# Patient Record
Sex: Male | Born: 1950 | Race: White | Hispanic: No | Marital: Single
Health system: Southern US, Community
[De-identification: ages and names within clinical notes are randomized; demographics above are authoritative.]

---

## 2021-01-05 ENCOUNTER — Other Ambulatory Visit: Payer: Self-pay

## 2021-01-05 DIAGNOSIS — F329 Major depressive disorder, single episode, unspecified: Secondary | ICD-10-CM | POA: Insufficient documentation

## 2021-01-05 DIAGNOSIS — R4701 Aphasia: Secondary | ICD-10-CM | POA: Diagnosis present

## 2021-01-05 DIAGNOSIS — F349 Persistent mood [affective] disorder, unspecified: Secondary | ICD-10-CM | POA: Insufficient documentation

## 2021-01-05 DIAGNOSIS — F39 Unspecified mood [affective] disorder: Secondary | ICD-10-CM | POA: Diagnosis not present

## 2021-01-05 DIAGNOSIS — G9389 Other specified disorders of brain: Secondary | ICD-10-CM | POA: Diagnosis not present

## 2021-01-05 DIAGNOSIS — F23 Brief psychotic disorder: Secondary | ICD-10-CM | POA: Insufficient documentation

## 2021-01-05 DIAGNOSIS — Z046 Encounter for general psychiatric examination, requested by authority: Secondary | ICD-10-CM | POA: Diagnosis present

## 2021-01-05 DIAGNOSIS — G319 Degenerative disease of nervous system, unspecified: Secondary | ICD-10-CM | POA: Insufficient documentation

## 2021-01-05 DIAGNOSIS — I6782 Cerebral ischemia: Secondary | ICD-10-CM | POA: Insufficient documentation

## 2021-01-05 DIAGNOSIS — Z20822 Contact with and (suspected) exposure to covid-19: Secondary | ICD-10-CM | POA: Insufficient documentation

## 2021-01-05 NOTE — ED Notes (Signed)
PT refusing blood at this time

## 2021-01-05 NOTE — ED Notes (Addendum)
Carney Bern overalls Sneakers Socks Black suitcase Jacket  Locked in bhu locker  825$, black wallet, [employer] [place] card and 2 sets of keys locked up in [employer]. Counted and verified amount by Melody EDT and Modena Bellemare RN.

## 2021-01-05 NOTE — ED Triage Notes (Signed)
Pt brought by [facility] today under IVC, pt denies any SI or HI at this time. States does feel depressed.

## 2021-01-06 ENCOUNTER — Emergency Department: Payer: Medicare Other

## 2021-01-06 ENCOUNTER — Emergency Department
Admission: EM | Admit: 2021-01-06 | Discharge: 2021-01-08 | Payer: Medicare Other | Attending: Emergency Medicine | Admitting: Emergency Medicine

## 2021-01-06 ENCOUNTER — Emergency Department
Admission: EM | Admit: 2021-01-06 | Discharge: 2021-01-06 | Disposition: A | Discharge status: Home / Self Care | Payer: Medicare Other | Attending: Emergency Medicine | Admitting: Emergency Medicine

## 2021-01-06 DIAGNOSIS — F349 Persistent mood [affective] disorder, unspecified: Secondary | ICD-10-CM | POA: Diagnosis not present

## 2021-01-06 DIAGNOSIS — G40909 Epilepsy, unspecified, not intractable, without status epilepticus: Secondary | ICD-10-CM

## 2021-01-06 DIAGNOSIS — F39 Unspecified mood [affective] disorder: Secondary | ICD-10-CM

## 2021-01-06 DIAGNOSIS — R4701 Aphasia: Secondary | ICD-10-CM

## 2021-01-06 DIAGNOSIS — S098XXA Other specified injuries of head, initial encounter: Secondary | ICD-10-CM

## 2021-01-06 DIAGNOSIS — F23 Brief psychotic disorder: Secondary | ICD-10-CM

## 2021-01-06 DIAGNOSIS — F32A Depression, unspecified: Secondary | ICD-10-CM

## 2021-01-06 DIAGNOSIS — F411 Generalized anxiety disorder: Secondary | ICD-10-CM

## 2021-01-06 LAB — URINALYSIS, COMPLETE (UACMP) WITH MICROSCOPIC
Bacteria, UA: NONE SEEN
Bilirubin Urine: NEGATIVE
Glucose, UA: NEGATIVE mg/dL
Ketones, ur: NEGATIVE mg/dL
Leukocytes,Ua: NEGATIVE
Nitrite: NEGATIVE
Protein, ur: NEGATIVE mg/dL
Specific Gravity, Urine: 1.003 — ABNORMAL LOW (ref 1.005–1.030)
Squamous Epithelial / LPF: NONE SEEN (ref 0–5)
pH: 7 (ref 5.0–8.0)

## 2021-01-06 LAB — RESP PANEL BY RT-PCR (FLU A&B, COVID) ARPGX2
Influenza A by PCR: NEGATIVE
Influenza B by PCR: NEGATIVE
SARS Coronavirus 2 by RT PCR: NEGATIVE

## 2021-01-06 LAB — COMPREHENSIVE METABOLIC PANEL
ALT: 15 U/L (ref 0–44)
AST: 16 U/L (ref 15–41)
Albumin: 3.8 g/dL (ref 3.5–5.0)
Alkaline Phosphatase: 88 U/L (ref 38–126)
Anion gap: 11 (ref 5–15)
BUN: 9 mg/dL (ref 8–23)
CO2: 26 mmol/L (ref 22–32)
Calcium: 8.6 mg/dL — ABNORMAL LOW (ref 8.9–10.3)
Chloride: 95 mmol/L — ABNORMAL LOW (ref 98–111)
Creatinine, Ser: 0.72 mg/dL (ref 0.61–1.24)
GFR, Estimated: >60 mL/min (ref >60)
Glucose, Bld: 102 mg/dL — ABNORMAL HIGH (ref 70–99)
Potassium: 4 mmol/L (ref 3.5–5.1)
Sodium: 132 mmol/L — ABNORMAL LOW (ref 135–145)
Total Bilirubin: 0.5 mg/dL (ref 0.3–1.2)
Total Protein: 8.2 g/dL — ABNORMAL HIGH (ref 6.5–8.1)

## 2021-01-06 LAB — CBC
HCT: 35.3 % — ABNORMAL LOW (ref 39.0–52.0)
Hemoglobin: 12.1 g/dL — ABNORMAL LOW (ref 13.0–17.0)
MCH: 30.4 pg (ref 26.0–34.0)
MCHC: 34.3 g/dL (ref 30.0–36.0)
MCV: 88.7 fL (ref 80.0–100.0)
Platelets: 284 K/uL (ref 150–400)
RBC: 3.98 MIL/uL — ABNORMAL LOW (ref 4.22–5.81)
RDW: 14.2 % (ref 11.5–15.5)
WBC: 5.6 K/uL (ref 4.0–10.5)
nRBC: 0 % (ref 0.0–0.2)

## 2021-01-06 LAB — TROPONIN I (HIGH SENSITIVITY): Troponin I (High Sensitivity): 7 ng/L (ref <18)

## 2021-01-06 LAB — PHENYTOIN LEVEL, TOTAL: Phenytoin Lvl: 33.6 ug/mL (ref 10.0–20.0)

## 2021-01-06 LAB — URINE DRUG SCREEN, QUALITATIVE (ARMC ONLY)
Amphetamines, Ur Screen: NOT DETECTED
Barbiturates, Ur Screen: NOT DETECTED
Benzodiazepine, Ur Scrn: NOT DETECTED
Cannabinoid 50 Ng, Ur ~~LOC~~: NOT DETECTED
Cocaine Metabolite,Ur ~~LOC~~: NOT DETECTED
MDMA (Ecstasy)Ur Screen: NOT DETECTED
Methadone Scn, Ur: NOT DETECTED
Opiate, Ur Screen: NOT DETECTED
Phencyclidine (PCP) Ur S: NOT DETECTED
Tricyclic, Ur Screen: NOT DETECTED

## 2021-01-06 LAB — PROTIME-INR
INR: 2.1 — ABNORMAL HIGH (ref 0.8–1.2)
Prothrombin Time: 23 seconds — ABNORMAL HIGH (ref 11.4–15.2)

## 2021-01-06 MED ORDER — WARFARIN SODIUM 7.5 MG PO TABS
7.5000 mg | ORAL_TABLET | Freq: Every day | ORAL | Status: DC
  Filled 2021-01-06: qty 1

## 2021-01-06 MED ORDER — PHENYTOIN SODIUM EXTENDED 100 MG PO CAPS: 400.0000 mg | ORAL_CAPSULE | Freq: Every day | ORAL | Status: DC

## 2021-01-06 MED ORDER — WARFARIN SODIUM 5 MG PO TABS
5.0000 mg | ORAL_TABLET | ORAL | Status: DC
  Administered 2021-01-06: 5 mg via ORAL
  Filled 2021-01-06: qty 1

## 2021-01-06 MED ORDER — WARFARIN - PHARMACIST DOSING INPATIENT
Freq: Every day | Status: DC
  Filled 2021-01-06: qty 1

## 2021-01-06 MED ORDER — MIRTAZAPINE 15 MG PO TABS
7.5000 mg | ORAL_TABLET | Freq: Every day | ORAL | Status: DC
  Administered 2021-01-06 – 2021-01-07 (×2): 7.5 mg via ORAL
  Filled 2021-01-06 (×2): qty 1

## 2021-01-06 MED ORDER — WARFARIN SODIUM 7.5 MG PO TABS: 7.5000 mg | ORAL_TABLET | ORAL | Status: DC
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
## 2021-01-06 NOTE — ED Notes (Signed)
Patient ambulated to restroom without difficulty or need for assistance.

## 2021-01-06 NOTE — BH Assessment (Incomplete Revision)
Comprehensive Clinical Assessment (CCA) Note  01/06/2021 George Mann [id number]  Chief Complaint: Patient is a 70 year old male presenting to [facility] ED under IVC. Per triage note Pt brought by bpd today under IVC, pt denies any SI or HI at this time. States does feel depressed. During assessment patient appears alert and oriented x1, patent was unaware of where he was, what the situation was or the time. Patient's speech was pressured, thought process was disorganized and patient had difficulty answering questions and giving concrete answers. When asked if patient understands why he is here patient reported 'I forgot what I was doing, I want to get out of jail." Per IVC patient has a history of seizure disorder, Aphasia, possible dementia, attempted to jump from moving vehicle, has suicidal thoughts, and access to firearms. Patient was unable to continue assessment as patient appeared drowsy and incoherent.  Per Psyc NP Elenore Paddy patient to be reassessed Chief Complaint  Patient presents with   Psychiatric Evaluation   Visit Diagnosis: Acute Psychosis   CCA Screening, Triage and Referral (STR)  Patient Reported Information How did you hear about [place]? Other (Comment)  Referral name: No data recorded Referral phone number: No data recorded  Whom do you see for routine medical problems? Other (Comment)  Practice/Facility Name: No data recorded Practice/Facility Phone Number: No data recorded Name of Contact: No data recorded Contact Number: No data recorded Contact Fax Number: No data recorded Prescriber Name: No data recorded Prescriber Address (if known): No data recorded  What Is the Reason for Your Visit/Call Today? Patient has been IVC'd  How Long Has This Been Causing You Problems? > than 6 months  What Do You Feel Would Help You the Most Today? Assessment Only   Have You Recently Been in Any Inpatient Treatment (Hospital/Detox/Crisis Center/28-Day Program)?  No  Name/Location of Program/Hospital:No data recorded How Long Were You There? No data recorded When Were You Discharged? No data recorded  Have You Ever Received Services From [facility] Before? No  Who Do You See at [facility]? No data recorded  Have You Recently Had Any Thoughts About Hurting Yourself? No  Are You Planning to Commit Suicide/Harm Yourself At This time? No   Have you Recently Had Thoughts About Hurting Someone Karolee Ohs? No  Explanation: No data recorded  Have You Used Any Alcohol or Drugs in the Past 24 Hours? No  How Long Ago Did You Use Drugs or Alcohol? No data recorded What Did You Use and How Much? No data recorded  Do You Currently Have a Therapist/Psychiatrist? No  Name of Therapist/Psychiatrist: No data recorded  Have You Been Recently Discharged From Any Office Practice or Programs? No  Explanation of Discharge From Practice/Program: No data recorded    CCA Screening Triage Referral Assessment Type of Contact: Face-to-Face  Is this Initial or Reassessment? No data recorded Date Telepsych consult ordered in [facility]:  No data recorded Time Telepsych consult ordered in [facility]:  No data recorded  Patient Reported Information Reviewed? Yes  Patient Left Without Being Seen? No data recorded Reason for Not Completing Assessment: No data recorded  Collateral Involvement: No data recorded  Does Patient Have a Court Appointed Legal Guardian? No data recorded Name and Contact of Legal Guardian: No data recorded If Minor and Not Living with Parent(s), Who has Custody? No data recorded Is CPS involved or ever been involved? Never  Is APS involved or ever been involved? Never   Patient Determined To Be At Risk  for Harm To Self or Others Based on Review of Patient Reported Information or Presenting Complaint? No  Method: No data recorded Availability of Means: No data recorded Intent: No data recorded Notification Required: No data recorded Additional  Information for Danger to Others Potential: No data recorded Additional Comments for Danger to Others Potential: No data recorded Are There Guns or Other Weapons in Your Home? No data recorded Types of Guns/Weapons: No data recorded Are These Weapons Safely Secured?                            No data recorded Who Could Verify You Are Able To Have These Secured: No data recorded Do You Have any Outstanding Charges, Pending Court Dates, Parole/Probation? No data recorded Contacted To Inform of Risk of Harm To Self or Others: No data recorded  Location of Assessment: [facility] ED   Does Patient Present under Involuntary Commitment? Yes  IVC Papers Initial File Date: 01/06/2021   [place] of Residence: [place]   Patient Currently Receiving the Following Services: No data recorded  Determination of Need: Emergent (2 hours)   Options For Referral: No data recorded    CCA Biopsychosocial Intake/Chief Complaint:  Patient is presenting under IVC due to attempting to jump from a moving vehicle  Current Symptoms/Problems: Patient is presenting under IVC due to attempting to jump from a moving vehicle   Patient Reported Schizophrenia/Schizoaffective Diagnosis in Past: No   Strengths: UTA  Preferences: UTA  Abilities: UTA   Type of Services Patient Feels are Needed: UTA   Initial Clinical Notes/Concerns: UTA   Mental Health Symptoms Depression:  Hopelessness   Duration of Depressive symptoms: Greater than two weeks   Mania:  None   Anxiety:   None   Psychosis:  None   Duration of Psychotic symptoms: No data recorded  Trauma:  None   Obsessions:  None   Compulsions:  None   Inattention:  None   Hyperactivity/Impulsivity:  N/A   Oppositional/Defiant Behaviors:  None   Emotional Irregularity:  None   Other Mood/Personality Symptoms:  No data recorded   Mental Status Exam Appearance and self-care  Stature:  Average   Weight:  Average weight   Clothing:   Neat/clean   Grooming:  Normal   Cosmetic use:  None   Posture/gait:  Normal   Motor activity:  Not Remarkable   Sensorium  Attention:  Confused   Concentration:  Scattered   Orientation:  Person   Recall/memory:  Defective in Short-term   Affect and Mood  Affect:  Appropriate   Mood:  Other (Comment)   Relating  Eye contact:  Normal   Facial expression:  Responsive   Attitude toward examiner:  Cooperative   Thought and Language  Speech flow: Pressured   Thought content:  -- (Disorganized)   Preoccupation:  Other (Comment)   Hallucinations:  None   Organization:  No data recorded  [employer] of Knowledge:  Fair   Intelligence:  Average   Abstraction:  Functional   Judgement:  Poor   Reality Testing:  Distorted   Insight:  Poor   Decision Making:  Impulsive   Social Functioning  Social Maturity:  Impulsive   Social Judgement:  Heedless   Stress  Stressors:  Other (Comment)   Coping Ability:  Deficient supports   Skill Deficits:  None   Supports:  Other (Comment)     Religion: Religion/Spirituality Are You A  Religious Person?: No  Leisure/Recreation: Leisure / Recreation Do You Have Hobbies?: No  Exercise/Diet: Exercise/Diet Do You Exercise?: No Have You Gained or Lost A Significant Amount of Weight in the Past Six Months?: No Do You Follow a Special Diet?: No Do You Have Any Trouble Sleeping?: No   CCA Employment/Education Employment/Work Situation: Employment / Work Situation 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 illness:  (Unknown) What is the longest time patient has a held a job?: Unknown Where was the patient employed at that time?: Unknown Has patient ever been in the [employer]?:  (Unknown)  Education: Education Is Patient Currently Attending School?: No Did Garment/textile technologist From [employer]?:  (Unknown) Did You Have An Individualized Education Program (IIEP):  No Did You Have Any Difficulty At [employer]?: No Patient's Education Has Been Impacted by Current Illness: No   CCA Family/Childhood History Family and Relationship History: Family history Marital status: Single Are you sexually active?:  (Unknown) What is your sexual orientation?: Unknown Has your sexual activity been affected by drugs, alcohol, medication, or emotional stress?: Unknown Does patient have children?: No  Childhood History:  Childhood History By whom was/is the patient raised?: Other (Comment) Additional childhood history information: UTA Description of patient's relationship with caregiver when they were a child: UTA Patient's description of current relationship with people who raised him/her: UTA How were you disciplined when you got in trouble as a child/adolescent?: UTA Does patient have siblings?:  (UTA) Did patient suffer any verbal/emotional/physical/sexual abuse as a child?:  (UTA) Did patient suffer from severe childhood neglect?:  (UTA) Has patient ever been sexually abused/assaulted/raped as an adolescent or adult?:  (UTA) Was the patient ever a victim of a crime or a disaster?:  (UTA) Witnessed domestic violence?:  (UTA) Has patient been affected by domestic violence as an adult?:  Industrial/product designer)  Child/Adolescent Assessment:     CCA Substance Use Alcohol/Drug Use: Alcohol / Drug Use Pain Medications: See MAR Prescriptions: See MAR Over the Counter: See MAR History of alcohol / drug use?: No history of alcohol / drug abuse                         ASAM's:  Six Dimensions of Multidimensional Assessment  Dimension 1:  Acute Intoxication and/or Withdrawal Potential:      Dimension 2:  Biomedical Conditions and Complications:      Dimension 3:  Emotional, Behavioral, or Cognitive Conditions and Complications:     Dimension 4:  Readiness to Change:     Dimension 5:  Relapse, Continued use, or Continued Problem Potential:     Dimension 6:   Recovery/Living Environment:     ASAM Severity Score:    ASAM Recommended Level of Treatment:     Substance use Disorder (SUD)    Recommendations for Services/Supports/Treatments:   Per Psyc NP Elenore Paddy patient to be reassessed  DSM5 Diagnoses: Patient Active Problem List   Diagnosis Date Noted   Acute psychosis (HCC) 01/06/2021   Seizure disorder (HCC) 01/06/2021    Patient Centered Plan: Patient is on the following Treatment Plan(s):  Anxiety   Referrals to Alternative Service(s): Referred to Alternative Service(s):   Place:   Date:   Time:    Referred to Alternative Service(s):   Place:   Date:   Time:    Referred to Alternative Service(s):   Place:   Date:   Time:    Referred to Alternative Service(s):   Place:  Date:   Time:     Rylen Swindler A Cynara Tatham, LCAS-A

## 2021-01-06 NOTE — BH Assessment (Addendum)
TTS contacted and spoke with Adair Laundry at [facility] (Inpatient Unit) [phone]. Adair Laundry reports she will call back once she has a chance to review patient information.   TTS spoke with Candice Camp [phone] (INPT) at [facility] stated to fax referral to [phone] for physician to review. TTS will follow up tomorrow.

## 2021-01-06 NOTE — ED Provider Notes (Incomplete Revision)
[facility] Emergency Department Provider Note   ____________________________________________   Event Date/Time   First MD Initiated Contact with Patient 01/06/21 0119     (approximate)  I have reviewed the triage vital signs and the nursing notes.   HISTORY  Chief Complaint Psychiatric Evaluation  Level V caveat: Limited by baseline aphasia  HPI George Mann is a 70 y.o. male brought to the ED under IVC for depression; jumped out of a moving vehicle today.  Patient uncooperative and that he does not wish to answer questions currently.  History of CVA with baseline aphasia.  Denies fever, cough, chest pain, shortness of breath, abdominal pain, nausea, vomiting or diarrhea.     Past medical history CVA  Patient Active Problem List   Diagnosis Date Noted   Acute psychosis (HCC) 01/06/2021   Seizure disorder (HCC) 01/06/2021    Prior to Admission medications   Not on File    Allergies Patient has no allergy information on record.  No family history on file.  Social History    Review of Systems  Constitutional: No fever/chills Eyes: No visual changes. ENT: No sore throat. Cardiovascular: Denies chest pain. Respiratory: Denies shortness of breath. Gastrointestinal: No abdominal pain.  No nausea, no vomiting.  No diarrhea.  No constipation. Genitourinary: Negative for dysuria. Musculoskeletal: Negative for back pain. Skin: Negative for rash. Neurological: Negative for headaches, focal weakness or numbness. Psychiatric:  Positive for depression.  ____________________________________________   PHYSICAL EXAM:  VITAL SIGNS: ED Triage Vitals [01/05/21 2228]  Enc Vitals Group     BP (!) 153/89     Pulse Rate 83     Resp 20     Temp 98.2 F (36.8 C)     Temp Source Oral     SpO2 100 %     Weight 160 lb (72.6 kg)     Height 5\' 8"  (1.727 m)     Head Circumference      Peak Flow      Pain Score 0     Pain Loc      Pain Edu?       Excl. in GC?     Constitutional: Alert and oriented.  Elderly appearing and in no acute distress. Eyes: Conjunctivae are normal. PERRL. EOMI. Head: Atraumatic. Nose: No congestion/rhinnorhea. Mouth/Throat: Mucous membranes are moist.  Oropharynx non-erythematous. Neck: No stridor.   Cardiovascular: Normal rate, regular rhythm. Grossly normal heart sounds.  Good peripheral circulation. Respiratory: Normal respiratory effort.  No retractions. Lungs CTAB. Gastrointestinal: Soft and nontender. No distention. No abdominal bruits. No CVA tenderness. Musculoskeletal: No lower extremity tenderness nor edema.  No joint effusions. Neurologic: Baseline aphasic speech and language. No gross focal neurologic deficits are appreciated. MAEx4. Skin:  Skin is warm, dry and intact. No rash noted. Psychiatric: Mood and affect are flat. Speech and behavior are normal.  ____________________________________________   LABS (all labs ordered are listed, but only abnormal results are displayed)  Labs Reviewed  CBC - Abnormal; Notable for the following components:      Result Value   RBC 3.98 (*)    Hemoglobin 12.1 (*)    HCT 35.3 (*)    All other components within normal limits  COMPREHENSIVE METABOLIC PANEL - Abnormal; Notable for the following components:   Sodium 132 (*)    Chloride 95 (*)    Glucose, Bld 102 (*)    Calcium 8.6 (*)    Total Protein 8.2 (*)    All other components  within normal limits  URINALYSIS, COMPLETE (UACMP) WITH MICROSCOPIC - Abnormal; Notable for the following components:   Color, Urine STRAW (*)    APPearance CLEAR (*)    Specific Gravity, Urine 1.003 (*)    Hgb urine dipstick SMALL (*)    All other components within normal limits  RESP PANEL BY RT-PCR (FLU A&B, COVID) ARPGX2  URINE DRUG SCREEN, QUALITATIVE ([facility] ONLY)  ETHANOL  TROPONIN I (HIGH SENSITIVITY)   ____________________________________________  EKG  ED ECG REPORT I, Alec Mcphee J, the attending  physician, personally viewed and interpreted this ECG.   Date: 01/06/2021  EKG Time: 0143  Rate: 77  Rhythm: normal EKG, normal sinus rhythm  Axis: Normal  Intervals:none  ST&T Change: Nonspecific  ____________________________________________  RADIOLOGY I, Adanely Reynoso J, personally viewed and evaluated these images (plain radiographs) as part of my medical decision making, as well as reviewing the written report by the radiologist.  ED MD interpretation: None  Official radiology report(s): No results found.  ____________________________________________   PROCEDURES  Procedure(s) performed (including Critical Care):  Procedures   ____________________________________________   INITIAL IMPRESSION / ASSESSMENT AND PLAN / ED COURSE  As part of my medical decision making, I reviewed the following data within the electronic MEDICAL RECORD NUMBER Nursing notes reviewed and incorporated, Labs reviewed, EKG interpreted, Old chart reviewed, A consult was requested and obtained from this/these consultant(s) Psychiatry and Notes from prior ED visits     70 year old male brought to the ED under IVC for jumping out of a moving vehicle. The patient has been placed in psychiatric observation due to the need to provide a safe environment for the patient while obtaining psychiatric consultation and evaluation, as well as ongoing medical and medication management to treat the patient's condition.  The patient has been placed under full IVC at this time.   Clinical Course as of 01/06/21 0548  Tue Jan 06, 2021  0132 Patient was seen by psychiatric NP; will be reassessed by psychiatrist in the morning. [JS]  [phone] Pharmacy tech attempted to reconcile patient's medications but it was unavailable.  Pharmacy will try in the morning to reach family or find patient's pharmacy. [JS]  [id number] Patient resting in no acute distress.  Remains in the ED under IVC pending psychiatric evaluation and disposition. [JS]     Clinical Course User Index [JS] Irean Hong, MD     ____________________________________________   FINAL CLINICAL IMPRESSION(S) / ED DIAGNOSES  Final diagnoses:  Depression, unspecified depression type     ED Discharge Orders     None       *Please note:  George Mann was evaluated in Emergency Department on 01/06/2021 for the symptoms described in the history of present illness. He was evaluated in the context of the global COVID-19 pandemic, which necessitated consideration that the patient might be at risk for infection with the SARS-CoV-2 virus that causes COVID-19. Institutional protocols and algorithms that pertain to the evaluation of patients at risk for COVID-19 are in a state of rapid change based on information released by regulatory bodies including the CDC and federal and state organizations. These policies and algorithms were followed during the patient's care in the ED.  Some ED evaluations and interventions may be delayed as a result of limited staffing during and the pandemic.*   Note:  This document was prepared using Dragon voice recognition software and may include unintentional dictation errors.   Irean Hong, MD 01/06/21 [phone]

## 2021-01-06 NOTE — BH Assessment (Incomplete Revision)
Comprehensive Clinical Assessment (CCA) Note  01/06/2021 George Mann [id number]  Chief Complaint: Patient is a 69 year old male presenting to [facility] ED under IVC. Per triage note Pt brought by bpd today under IVC, pt denies any SI or HI at this time. States does feel depressed. During assessment patient appears alert and oriented x1, patent was unaware of where he was, what the situation was or the time. Patient's speech was pressured, thought process was disorganized and patient had difficulty answering questions and giving concrete answers. When asked if patient understands why he is here patient reported 'I forgot what I was doing, I want to get out of jail." Per IVC patient has a history of seizure disorder, Aphasia, possible dementia, attempted to jump from moving vehicle, has suicidal thoughts, and access to firearms. Patient was unable to continue assessment as patient appeared drowsy and incoherent.  Per Psyc NP Jackie Thompson patient to be reassessed Chief Complaint  Patient presents with   Psychiatric Evaluation   Visit Diagnosis: Acute Psychosis   CCA Screening, Triage and Referral (STR)  Patient Reported Information How did you hear about us? Other (Comment)  Referral name: No data recorded Referral phone number: No data recorded  Whom do you see for routine medical problems? Other (Comment)  Practice/Facility Name: No data recorded Practice/Facility Phone Number: No data recorded Name of Contact: No data recorded Contact Number: No data recorded Contact Fax Number: No data recorded Prescriber Name: No data recorded Prescriber Address (if known): No data recorded  What Is the Reason for Your Visit/Call Today? Patient has been IVC'd  How Long Has This Been Causing You Problems? > than 6 months  What Do You Feel Would Help You the Most Today? Assessment Only   Have You Recently Been in Any Inpatient Treatment (Hospital/Detox/Crisis Center/28-Day Program)?  No  Name/Location of Program/Hospital:No data recorded How Long Were You There? No data recorded When Were You Discharged? No data recorded  Have You Ever Received Services From [facility] Before? No  Who Do You See at ? No data recorded  Have You Recently Had Any Thoughts About Hurting Yourself? No  Are You Planning to Commit Suicide/Harm Yourself At This time? No   Have you Recently Had Thoughts About Hurting Someone Else? No  Explanation: No data recorded  Have You Used Any Alcohol or Drugs in the Past 24 Hours? No  How Long Ago Did You Use Drugs or Alcohol? No data recorded What Did You Use and How Much? No data recorded  Do You Currently Have a Therapist/Psychiatrist? No  Name of Therapist/Psychiatrist: No data recorded  Have You Been Recently Discharged From Any Office Practice or Programs? No  Explanation of Discharge From Practice/Program: No data recorded    CCA Screening Triage Referral Assessment Type of Contact: Face-to-Face  Is this Initial or Reassessment? No data recorded Date Telepsych consult ordered in [facility]:  No data recorded Time Telepsych consult ordered in [facility]:  No data recorded  Patient Reported Information Reviewed? Yes  Patient Left Without Being Seen? No data recorded Reason for Not Completing Assessment: No data recorded  Collateral Involvement: No data recorded  Does Patient Have a Court Appointed Legal Guardian? No data recorded Name and Contact of Legal Guardian: No data recorded If Minor and Not Living with Parent(s), Who has Custody? No data recorded Is CPS involved or ever been involved? Never  Is APS involved or ever been involved? Never   Patient Determined To Be At Risk   for Harm To Self or Others Based on Review of Patient Reported Information or Presenting Complaint? No  Method: No data recorded Availability of Means: No data recorded Intent: No data recorded Notification Required: No data recorded Additional  Information for Danger to Others Potential: No data recorded Additional Comments for Danger to Others Potential: No data recorded Are There Guns or Other Weapons in Your Home? No data recorded Types of Guns/Weapons: No data recorded Are These Weapons Safely Secured?                            No data recorded Who Could Verify You Are Able To Have These Secured: No data recorded Do You Have any Outstanding Charges, Pending Court Dates, Parole/Probation? No data recorded Contacted To Inform of Risk of Harm To Self or Others: No data recorded  Location of Assessment: [facility] ED   Does Patient Present under Involuntary Commitment? Yes  IVC Papers Initial File Date: 01/06/2021   County of Residence: [place]   Patient Currently Receiving the Following Services: No data recorded  Determination of Need: Emergent (2 hours)   Options For Referral: No data recorded    CCA Biopsychosocial Intake/Chief Complaint:  Patient is presenting under IVC due to attempting to jump from a moving vehicle  Current Symptoms/Problems: Patient is presenting under IVC due to attempting to jump from a moving vehicle   Patient Reported Schizophrenia/Schizoaffective Diagnosis in Past: No   Strengths: UTA  Preferences: UTA  Abilities: UTA   Type of Services Patient Feels are Needed: UTA   Initial Clinical Notes/Concerns: UTA   Mental Health Symptoms Depression:  Hopelessness   Duration of Depressive symptoms: Greater than two weeks   Mania:  None   Anxiety:   None   Psychosis:  None   Duration of Psychotic symptoms: No data recorded  Trauma:  None   Obsessions:  None   Compulsions:  None   Inattention:  None   Hyperactivity/Impulsivity:  N/A   Oppositional/Defiant Behaviors:  None   Emotional Irregularity:  None   Other Mood/Personality Symptoms:  No data recorded   Mental Status Exam Appearance and self-care  Stature:  Average   Weight:  Average weight   Clothing:   Neat/clean   Grooming:  Normal   Cosmetic use:  None   Posture/gait:  Normal   Motor activity:  Not Remarkable   Sensorium  Attention:  Confused   Concentration:  Scattered   Orientation:  Person   Recall/memory:  Defective in Short-term   Affect and Mood  Affect:  Appropriate   Mood:  Other (Comment)   Relating  Eye contact:  Normal   Facial expression:  Responsive   Attitude toward examiner:  Cooperative   Thought and Language  Speech flow: Pressured   Thought content:  -- (Disorganized)   Preoccupation:  Other (Comment)   Hallucinations:  None   Organization:  No data recorded  Executive Functions  Fund of Knowledge:  Fair   Intelligence:  Average   Abstraction:  Functional   Judgement:  Poor   Reality Testing:  Distorted   Insight:  Poor   Decision Making:  Impulsive   Social Functioning  Social Maturity:  Impulsive   Social Judgement:  Heedless   Stress  Stressors:  Other (Comment)   Coping Ability:  Deficient supports   Skill Deficits:  None   Supports:  Other (Comment)     Religion: Religion/Spirituality Are You A   Religious Person?: No  Leisure/Recreation: Leisure / Recreation Do You Have Hobbies?: No  Exercise/Diet: Exercise/Diet Do You Exercise?: No Have You Gained or Lost A Significant Amount of Weight in the Past Six Months?: No Do You Follow a Special Diet?: No Do You Have Any Trouble Sleeping?: No   CCA Employment/Education Employment/Work Situation: Employment / Work Situation Employment situation: Unemployed Patient's job has been impacted by current illness:  (Unknown) What is the longest time patient has a held a job?: Unknown Where was the patient employed at that time?: Unknown Has patient ever been in the military?:  (Unknown)  Education: Education Is Patient Currently Attending School?: No Did You Graduate From High School?:  (Unknown) Did You Have An Individualized Education Program (IIEP):  No Did You Have Any Difficulty At School?: No Patient's Education Has Been Impacted by Current Illness: No   CCA Family/Childhood History Family and Relationship History: Family history Marital status: Single Are you sexually active?:  (Unknown) What is your sexual orientation?: Unknown Has your sexual activity been affected by drugs, alcohol, medication, or emotional stress?: Unknown Does patient have children?: No  Childhood History:  Childhood History By whom was/is the patient raised?: Other (Comment) Additional childhood history information: UTA Description of patient's relationship with caregiver when they were a child: UTA Patient's description of current relationship with people who raised him/her: UTA How were you disciplined when you got in trouble as a child/adolescent?: UTA Does patient have siblings?:  (UTA) Did patient suffer any verbal/emotional/physical/sexual abuse as a child?:  (UTA) Did patient suffer from severe childhood neglect?:  (UTA) Has patient ever been sexually abused/assaulted/raped as an adolescent or adult?:  (UTA) Was the patient ever a victim of a crime or a disaster?:  (UTA) Witnessed domestic violence?:  (UTA) Has patient been affected by domestic violence as an adult?:  (UTA)  Child/Adolescent Assessment:     CCA Substance Use Alcohol/Drug Use: Alcohol / Drug Use Pain Medications: See MAR Prescriptions: See MAR Over the Counter: See MAR History of alcohol / drug use?: No history of alcohol / drug abuse                         ASAM's:  Six Dimensions of Multidimensional Assessment  Dimension 1:  Acute Intoxication and/or Withdrawal Potential:      Dimension 2:  Biomedical Conditions and Complications:      Dimension 3:  Emotional, Behavioral, or Cognitive Conditions and Complications:     Dimension 4:  Readiness to Change:     Dimension 5:  Relapse, Continued use, or Continued Problem Potential:     Dimension 6:   Recovery/Living Environment:     ASAM Severity Score:    ASAM Recommended Level of Treatment:     Substance use Disorder (SUD)    Recommendations for Services/Supports/Treatments:   Per Psyc NP Jackie Thompson patient to be reassessed  DSM5 Diagnoses: Patient Active Problem List   Diagnosis Date Noted   Acute psychosis (HCC) 01/06/2021   Seizure disorder (HCC) 01/06/2021    Patient Centered Plan: Patient is on the following Treatment Plan(s):  Anxiety   Referrals to Alternative Service(s): Referred to Alternative Service(s):   Place:   Date:   Time:    Referred to Alternative Service(s):   Place:   Date:   Time:    Referred to Alternative Service(s):   Place:   Date:   Time:    Referred to Alternative Service(s):   Place:     Date:   Time:     Jamila A Handy, LCAS-A 

## 2021-01-06 NOTE — Consult Note (Signed)
Psychiatry, full note to follow: I have compiled several useful telephone numbers for this patient. Lyn Marcucci (sister - lives in [place] but know current history well) [phone] Maurice March Hessey(sp?) Close friend who was with patient yesterday [phone] Calla Kicks - ex-wife who remains very involved in his life [phone]

## 2021-01-06 NOTE — Consult Note (Cosign Needed Addendum)
[facility] Face-to-Face Psychiatry Consult   Reason for Consult: Psychiatric evaluation Referring Physician: Dr. Dolores Frame Patient Identification: George Mann MRN:  [id number] Principal Diagnosis: <principal problem not specified> Diagnosis:  Active Problems:   Acute psychosis (HCC)   Seizure disorder (HCC)  Total Time spent with patient: 20 minutes  Subjective: " I know where I am.  I am in the hospital." George Mann is a 70 y.o. male patient presented to [facility] ED via law enforcement under involuntary commitment status (IVC). Per the ED triage nurse note, Pt brought by bpd today under IVC, pt denies any SI or HI at this time. States does feel depressed.  The patient is a poor historian. The patient was asked why did he had come to the hospital? " I forgot what I was doing, and I wanted to get out of jail." The patient is speaking nonsensically.  Per the IVC paperwork, he states "history of seizure disorder, aphasia, possible dementia, attempted to jump from moving vehicle, suicidal thoughts and access to firearms." The patient was seen face-to-face by this provider; the chart was reviewed and consulted with Dr. Dolores Frame on 01/06/2021 due to the patient's care. It was discussed with the EDP that the patient would remain under observation overnight and will be reassessed in the a.m. to determine if he meets the criteria for psychiatric inpatient admission; he could be discharged.  On evaluation, the patient is alert and oriented x 2, calm and cooperative, but unable to comprehend questions asked. The patient is speaking nonsensically, words salad. Therefore he is unable to participate in the assessment process fully.  HPI: per Dr. Dolores Frame, [name] is a 70 y.o. male brought to the ED under IVC for depression; jumped out of a moving vehicle today.  Patient uncooperative and that he does not wish to answer questions currently.  History of CVA with baseline aphasia.  Denies fever, cough, chest pain, shortness of  breath, abdominal pain, nausea, vomiting or diarrhea.  Past Psychiatric History:   Risk to Self:   Risk to Others:   Prior Inpatient Therapy:   Prior Outpatient Therapy:    Past Medical History: No past medical history on file.  Family History: No family history on file. Family Psychiatric  History:  Social History:  Social History   Substance and Sexual Activity  Alcohol Use Not on file     Social History   Substance and Sexual Activity  Drug Use Not on file    Social History   Socioeconomic History  . Marital status: Divorced    Spouse name: Not on file  . Number of children: Not on file  . Years of education: Not on file  . Highest education level: Not on file  Occupational History  . Not on file  Tobacco Use  . Smoking status: Not on file  . Smokeless tobacco: Not on file  Substance and Sexual Activity  . Alcohol use: Not on file  . Drug use: Not on file  . Sexual activity: Not on file  Other Topics Concern  . Not on file  Social History Narrative  . Not on file   Social Determinants of Health   Financial Resource Strain: Not on file  Food Insecurity: Not on file  Transportation Needs: Not on file  Physical Activity: Not on file  Stress: Not on file  Social Connections: Not on file   Additional Social History:    Allergies:  Not on File  Labs:  Results for orders placed or performed  during the hospital encounter of 01/06/21 (from the past 48 hour(s))  CBC     Status: Abnormal   Collection Time: 01/05/21 10:32 PM  Result Value Ref Range   WBC 5.6 4.0 - 10.5 K/uL   RBC 3.98 (L) 4.22 - 5.81 MIL/uL   Hemoglobin 12.1 (L) 13.0 - 17.0 g/dL   HCT 75.1 (L) 02.5 - 85.2 %   MCV 88.7 80.0 - 100.0 fL   MCH 30.4 26.0 - 34.0 pg   MCHC 34.3 30.0 - 36.0 g/dL   RDW 77.8 24.2 - 35.3 %   Platelets 284 150 - 400 K/uL   nRBC 0.0 0.0 - 0.2 %    Comment: Performed at [facility], [address] [place], [place] [address]  Comprehensive metabolic  panel     Status: Abnormal   Collection Time: 01/05/21 10:32 PM  Result Value Ref Range   Sodium 132 (L) 135 - 145 mmol/L   Potassium 4.0 3.5 - 5.1 mmol/L   Chloride 95 (L) 98 - 111 mmol/L   CO2 26 22 - 32 mmol/L   Glucose, Bld 102 (H) 70 - 99 mg/dL    Comment: Glucose reference range applies only to samples taken after fasting for at least 8 hours.   BUN 9 8 - 23 mg/dL   Creatinine, Ser 1.54 0.61 - 1.24 mg/dL   Calcium 8.6 (L) 8.9 - 10.3 mg/dL   Total Protein 8.2 (H) 6.5 - 8.1 g/dL   Albumin 3.8 3.5 - 5.0 g/dL   AST 16 15 - 41 U/L   ALT 15 0 - 44 U/L   Alkaline Phosphatase 88 38 - 126 U/L   Total Bilirubin 0.5 0.3 - 1.2 mg/dL   GFR, Estimated >00 >86 mL/min    Comment: (NOTE) Calculated using the CKD-EPI Creatinine Equation (2021)    Anion gap 11 5 - 15    Comment: Performed at [facility], [address] [place], [place] [address]  Urine Drug Screen, Qualitative ([facility] only)     Status: None   Collection Time: 01/05/21 10:32 PM  Result Value Ref Range   Tricyclic, Ur Screen NONE DETECTED NONE DETECTED   Amphetamines, Ur Screen NONE DETECTED NONE DETECTED   MDMA (Ecstasy)Ur Screen NONE DETECTED NONE DETECTED   Cocaine Metabolite,Ur [place] NONE DETECTED NONE DETECTED   Opiate, Ur Screen NONE DETECTED NONE DETECTED   Phencyclidine (PCP) Ur S NONE DETECTED NONE DETECTED   Cannabinoid 50 Ng, Ur  NONE DETECTED NONE DETECTED   Barbiturates, Ur Screen NONE DETECTED NONE DETECTED   Benzodiazepine, Ur Scrn NONE DETECTED NONE DETECTED   Methadone Scn, Ur NONE DETECTED NONE DETECTED    Comment: (NOTE) Tricyclics + metabolites, urine    Cutoff 1000 ng/mL Amphetamines + metabolites, urine  Cutoff 1000 ng/mL MDMA (Ecstasy), urine              Cutoff 500 ng/mL Cocaine Metabolite, urine          Cutoff 300 ng/mL Opiate + metabolites, urine        Cutoff 300 ng/mL Phencyclidine (PCP), urine         Cutoff 25 ng/mL Cannabinoid, urine                 Cutoff 50 ng/mL Barbiturates +  metabolites, urine  Cutoff 200 ng/mL Benzodiazepine, urine              Cutoff 200 ng/mL Methadone, urine  Cutoff 300 ng/mL  The urine drug screen provides only a preliminary, unconfirmed analytical test result and should not be used for non-medical purposes. Clinical consideration and professional judgment should be applied to any positive drug screen result due to possible interfering substances. A more specific alternate chemical method must be used in order to obtain a confirmed analytical result. Gas chromatography / mass spectrometry (GC/MS) is the preferred confirm atory method. Performed at [facility], [address] [place], [place] [address]   Urinalysis, Complete w Microscopic Urine, Clean Catch     Status: Abnormal   Collection Time: 01/05/21 10:32 PM  Result Value Ref Range   Color, Urine STRAW (A) YELLOW   APPearance CLEAR (A) CLEAR   Specific Gravity, Urine 1.003 (L) 1.005 - 1.030   pH 7.0 5.0 - 8.0   Glucose, UA NEGATIVE NEGATIVE mg/dL   Hgb urine dipstick SMALL (A) NEGATIVE   Bilirubin Urine NEGATIVE NEGATIVE   Ketones, ur NEGATIVE NEGATIVE mg/dL   Protein, ur NEGATIVE NEGATIVE mg/dL   Nitrite NEGATIVE NEGATIVE   Leukocytes,Ua NEGATIVE NEGATIVE   RBC / HPF 0-5 0 - 5 RBC/hpf   WBC, UA 0-5 0 - 5 WBC/hpf   Bacteria, UA NONE SEEN NONE SEEN   Squamous Epithelial / LPF NONE SEEN 0 - 5    Comment: Performed at [facility], [address] [place], [place] [address]  Troponin I (High Sensitivity)     Status: None   Collection Time: 01/06/21  1:19 AM  Result Value Ref Range   Troponin I (High Sensitivity) 7 <18 ng/L    Comment: (NOTE) Elevated high sensitivity troponin I (hsTnI) values and significant  changes across serial measurements may suggest ACS but many other  chronic and acute conditions are known to elevate hsTnI results.  Refer to the "Links" section for chest pain algorithms and additional  guidance. Performed at  [facility], [address] [place], [place] [address]     No current facility-administered medications for this encounter.   No current outpatient medications on file.    Musculoskeletal: Strength & Muscle Tone: within normal limits Gait & Station: normal Patient leans: N/A  Psychiatric Specialty Exam: Physical Exam Vitals and nursing note reviewed.  Constitutional:      General: He is in acute distress.     Appearance: He is normal weight.  HENT:     Nose: Nose normal.     Mouth/Throat:     Mouth: Mucous membranes are moist.  Pulmonary:     Effort: Pulmonary effort is normal.  Musculoskeletal:        General: Normal range of motion.     Cervical back: Normal range of motion and neck supple.  Neurological:     Mental Status: He is alert.  Psychiatric:        Attention and Perception: He is inattentive.        Mood and Affect: Mood is anxious. Affect is blunt and flat.        Behavior: Behavior is slowed and aggressive.        Cognition and Memory: Memory is impaired. He exhibits impaired recent memory.        Judgment: Judgment is impulsive and inappropriate.     Review of Systems  All other systems reviewed and are negative.   Blood pressure (!) 153/89, pulse 83, temperature 98.2 F (36.8 C), temperature source Oral, resp. rate 20, height 5\' 8"  (1.727 m), weight 72.6 kg, SpO2 100 %.Body  mass index is 24.33 kg/m.  General Appearance: Bizarre  Eye Contact:  Poor  Speech:  Blocked and Slow  Volume:  Decreased  Mood:  Angry, Anxious, Depressed and Irritable  Affect:  Blunt, Depressed and Inappropriate  Thought Process:  Disorganized  Orientation:  Other:  A&Ox2  Thought Content:  Illogical  Suicidal Thoughts:  No  Homicidal Thoughts:  No  Memory:  Immediate;   Poor Recent;   Poor Remote;   Poor  Judgement:  Impaired  Insight:  Lacking  Psychomotor Activity:  Increased  Concentration:  Concentration: Poor and Attention Span: Poor  Recall:  Poor   Fund of Knowledge:  Poor  Language:  Poor  Akathisia:  Negative  Handed:  Right  AIMS (if indicated):     Assets:  Communication Skills Desire for Improvement Resilience Social Support  ADL's:  Intact  Cognition:  Impaired,  Mild  Sleep:      Treatment Plan Summary: Daily contact with patient to assess and evaluate symptoms and progress in treatment, Medication management and Plan The patient remained under observation overnight and will be reassessed in the a.m. to determine if he meets the criteria for psychiatric inpatient admission; he could be discharged   Disposition:  The patient remained under observation overnight and will be reassessed in the a.m. to determine if he meets the criteria for psychiatric inpatient admission; he could be discharged   Gillermo MurdochJacqueline Brittin Janik, NP 01/06/2021 3:33 AM

## 2021-01-06 NOTE — ED Provider Notes (Signed)
I have been texting with Dr. Toni Amend he feels that at present with the negative CT we do not have to repeat the MRI.  We will see how he does with the Dilantin level once it comes down.   Arnaldo Natal, MD 01/06/21 1655

## 2021-01-06 NOTE — ED Notes (Addendum)
Pt has key 17  in pyxis for valuables.

## 2021-01-06 NOTE — ED Provider Notes (Signed)
Emergency Medicine Observation Re-evaluation Note  George Mann is a 70 y.o. male, seen on rounds today.  Pt initially presented to the ED for complaints of No chief complaint on file. Currently, the patient is pending psychiatric disposition, also continues under medical observation for elevated Dilantin level.  Physical Exam  BP 132/69 (BP Location: Right Arm)   Pulse 72   Temp 98.8 F (37.1 C) (Oral)   Resp 18   SpO2 93%  Physical Exam General: He is alert, oriented, converses but has dysarthric speech which he tells me has been going on for several years. 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  He had a CT of the head today that was reassuring without evidence of acute finding but an elevated Dilantin level  ED Course / MDM  EKG:EKG Interpretation  Date/Time:  Tuesday January 06 2021 01:43:30 EST Ventricular Rate:  77 PR Interval:  150 QRS Duration: 86 QT Interval:  376 QTC Calculation: 425 R Axis:   -28 Text Interpretation: Normal sinus rhythm Normal ECG Confirmed by UNCONFIRMED, DOCTOR ([id number]), editor Fredric Mare, Tammy [phone]) on 01/06/2021 10:34:55 AM    I have reviewed the labs performed to date as well as medications administered while in observation.  Recent changes in the last 24 hours include   CT of the head, being observed closely and followed by psychiatry, for his presentation as well as Dilantin level continues under medical observation as well  CT HEAD WO CONTRAST  Result Date: 01/06/2021 CLINICAL DATA:  Chronic aphasia mental status change EXAM: CT HEAD WITHOUT CONTRAST TECHNIQUE: Contiguous axial images were obtained from the  base of the skull through the vertex without intravenous contrast. COMPARISON:  None. FINDINGS: Brain: No acute territorial infarction, intracranial hemorrhage or mass is visualized. Mild atrophy. Encephalomalacia within the left temporal and parietal lobes with involvement of left insula and basal ganglia. Mild ex vacuo dilatation of left lateral ventricle. Mild hypodensity in the white matter likely chronic small vessel ischemic change Vascular: No hyperdense vessels.  No unexpected calcification. Skull: Left temporoparietal craniotomy. Clips within the medial aspect of left middle cranial fossa. No fracture Sinuses/Orbits: No acute finding. Other: None IMPRESSION: 1. No CT evidence for acute intracranial abnormality. 2. Left temporoparietal and basal ganglial encephalomalacia 3. Atrophy and mild chronic small vessel ischemic changes of the white matter. Electronically Signed   By: Jasmine Pang M.D.   On: 01/06/2021 15:16    CT head reviewed negative for acute finding.  .  Plan  Current plan is for  Ongoing psychiatric disposition.  Continue to observe medically as well for elevated Dilantin level   Sharyn Creamer, MD 01/06/21 [phone]

## 2021-01-06 NOTE — BH Assessment (Addendum)
Comprehensive Clinical Assessment (CCA) Note  01/06/2021 George Mann [id number]  Chief Complaint: Patient is a 69 year old male presenting to [facility] ED under IVC. Per triage note Pt brought by bpd today under IVC, pt denies any SI or HI at this time. States does feel depressed. During assessment patient appears alert and oriented x1, patent was unaware of where he was, what the situation was or the time. Patient's speech was pressured, thought process was disorganized and patient had difficulty answering questions and giving concrete answers. When asked if patient understands why he is here patient reported 'I forgot what I was doing, I want to get out of jail." Per IVC patient has a history of seizure disorder, Aphasia, possible dementia, attempted to jump from moving vehicle, has suicidal thoughts, and access to firearms. Patient was unable to continue assessment as patient appeared drowsy and incoherent.  Per Psyc NP Jackie Thompson patient to be reassessed Chief Complaint  Patient presents with   Psychiatric Evaluation   Visit Diagnosis: Acute Psychosis   CCA Screening, Triage and Referral (STR)  Patient Reported Information How did you hear about us? Other (Comment)  Referral name: No data recorded Referral phone number: No data recorded  Whom do you see for routine medical problems? Other (Comment)  Practice/Facility Name: No data recorded Practice/Facility Phone Number: No data recorded Name of Contact: No data recorded Contact Number: No data recorded Contact Fax Number: No data recorded Prescriber Name: No data recorded Prescriber Address (if known): No data recorded  What Is the Reason for Your Visit/Call Today? Patient has been IVC'd  How Long Has This Been Causing You Problems? > than 6 months  What Do You Feel Would Help You the Most Today? Assessment Only   Have You Recently Been in Any Inpatient Treatment (Hospital/Detox/Crisis Center/28-Day Program)?  No  Name/Location of Program/Hospital:No data recorded How Long Were You There? No data recorded When Were You Discharged? No data recorded  Have You Ever Received Services From [facility] Before? No  Who Do You See at [facility]? No data recorded  Have You Recently Had Any Thoughts About Hurting Yourself? No  Are You Planning to Commit Suicide/Harm Yourself At This time? No   Have you Recently Had Thoughts About Hurting Someone Else? No  Explanation: No data recorded  Have You Used Any Alcohol or Drugs in the Past 24 Hours? No  How Long Ago Did You Use Drugs or Alcohol? No data recorded What Did You Use and How Much? No data recorded  Do You Currently Have a Therapist/Psychiatrist? No  Name of Therapist/Psychiatrist: No data recorded  Have You Been Recently Discharged From Any Office Practice or Programs? No  Explanation of Discharge From Practice/Program: No data recorded    CCA Screening Triage Referral Assessment Type of Contact: Face-to-Face  Is this Initial or Reassessment? No data recorded Date Telepsych consult ordered in [facility]:  No data recorded Time Telepsych consult ordered in [facility]:  No data recorded  Patient Reported Information Reviewed? Yes  Patient Left Without Being Seen? No data recorded Reason for Not Completing Assessment: No data recorded  Collateral Involvement: No data recorded  Does Patient Have a Court Appointed Legal Guardian? No data recorded Name and Contact of Legal Guardian: No data recorded If Minor and Not Living with Parent(s), Who has Custody? No data recorded Is CPS involved or ever been involved? Never  Is APS involved or ever been involved? Never   Patient Determined To Be At Risk   for Harm To Self or Others Based on Review of Patient Reported Information or Presenting Complaint? No  Method: No data recorded Availability of Means: No data recorded Intent: No data recorded Notification Required: No data recorded Additional  Information for Danger to Others Potential: No data recorded Additional Comments for Danger to Others Potential: No data recorded Are There Guns or Other Weapons in Your Home? No data recorded Types of Guns/Weapons: No data recorded Are These Weapons Safely Secured?                            No data recorded Who Could Verify You Are Able To Have These Secured: No data recorded Do You Have any Outstanding Charges, Pending Court Dates, Parole/Probation? No data recorded Contacted To Inform of Risk of Harm To Self or Others: No data recorded  Location of Assessment: [facility] ED   Does Patient Present under Involuntary Commitment? Yes  IVC Papers Initial File Date: 01/06/2021   County of Residence: [place]   Patient Currently Receiving the Following Services: No data recorded  Determination of Need: Emergent (2 hours)   Options For Referral: No data recorded    CCA Biopsychosocial Intake/Chief Complaint:  Patient is presenting under IVC due to attempting to jump from a moving vehicle  Current Symptoms/Problems: Patient is presenting under IVC due to attempting to jump from a moving vehicle   Patient Reported Schizophrenia/Schizoaffective Diagnosis in Past: No   Strengths: UTA  Preferences: UTA  Abilities: UTA   Type of Services Patient Feels are Needed: UTA   Initial Clinical Notes/Concerns: UTA   Mental Health Symptoms Depression:  Hopelessness   Duration of Depressive symptoms: Greater than two weeks   Mania:  None   Anxiety:   None   Psychosis:  None   Duration of Psychotic symptoms: No data recorded  Trauma:  None   Obsessions:  None   Compulsions:  None   Inattention:  None   Hyperactivity/Impulsivity:  N/A   Oppositional/Defiant Behaviors:  None   Emotional Irregularity:  None   Other Mood/Personality Symptoms:  No data recorded   Mental Status Exam Appearance and self-care  Stature:  Average   Weight:  Average weight   Clothing:   Neat/clean   Grooming:  Normal   Cosmetic use:  None   Posture/gait:  Normal   Motor activity:  Not Remarkable   Sensorium  Attention:  Confused   Concentration:  Scattered   Orientation:  Person   Recall/memory:  Defective in Short-term   Affect and Mood  Affect:  Appropriate   Mood:  Other (Comment)   Relating  Eye contact:  Normal   Facial expression:  Responsive   Attitude toward examiner:  Cooperative   Thought and Language  Speech flow: Pressured   Thought content:  -- (Disorganized)   Preoccupation:  Other (Comment)   Hallucinations:  None   Organization:  No data recorded  Executive Functions  Fund of Knowledge:  Fair   Intelligence:  Average   Abstraction:  Functional   Judgement:  Poor   Reality Testing:  Distorted   Insight:  Poor   Decision Making:  Impulsive   Social Functioning  Social Maturity:  Impulsive   Social Judgement:  Heedless   Stress  Stressors:  Other (Comment)   Coping Ability:  Deficient supports   Skill Deficits:  None   Supports:  Other (Comment)     Religion: Religion/Spirituality Are You A   Religious Person?: No  Leisure/Recreation: Leisure / Recreation Do You Have Hobbies?: No  Exercise/Diet: Exercise/Diet Do You Exercise?: No Have You Gained or Lost A Significant Amount of Weight in the Past Six Months?: No Do You Follow a Special Diet?: No Do You Have Any Trouble Sleeping?: No   CCA Employment/Education Employment/Work Situation: Employment / Work Situation Employment situation: Unemployed Patient's job has been impacted by current illness:  (Unknown) What is the longest time patient has a held a job?: Unknown Where was the patient employed at that time?: Unknown Has patient ever been in the military?:  (Unknown)  Education: Education Is Patient Currently Attending School?: No Did You Graduate From High School?:  (Unknown) Did You Have An Individualized Education Program (IIEP):  No Did You Have Any Difficulty At School?: No Patient's Education Has Been Impacted by Current Illness: No   CCA Family/Childhood History Family and Relationship History: Family history Marital status: Single Are you sexually active?:  (Unknown) What is your sexual orientation?: Unknown Has your sexual activity been affected by drugs, alcohol, medication, or emotional stress?: Unknown Does patient have children?: No  Childhood History:  Childhood History By whom was/is the patient raised?: Other (Comment) Additional childhood history information: UTA Description of patient's relationship with caregiver when they were a child: UTA Patient's description of current relationship with people who raised him/her: UTA How were you disciplined when you got in trouble as a child/adolescent?: UTA Does patient have siblings?:  (UTA) Did patient suffer any verbal/emotional/physical/sexual abuse as a child?:  (UTA) Did patient suffer from severe childhood neglect?:  (UTA) Has patient ever been sexually abused/assaulted/raped as an adolescent or adult?:  (UTA) Was the patient ever a victim of a crime or a disaster?:  (UTA) Witnessed domestic violence?:  (UTA) Has patient been affected by domestic violence as an adult?:  (UTA)  Child/Adolescent Assessment:     CCA Substance Use Alcohol/Drug Use: Alcohol / Drug Use Pain Medications: See MAR Prescriptions: See MAR Over the Counter: See MAR History of alcohol / drug use?: No history of alcohol / drug abuse                         ASAM's:  Six Dimensions of Multidimensional Assessment  Dimension 1:  Acute Intoxication and/or Withdrawal Potential:      Dimension 2:  Biomedical Conditions and Complications:      Dimension 3:  Emotional, Behavioral, or Cognitive Conditions and Complications:     Dimension 4:  Readiness to Change:     Dimension 5:  Relapse, Continued use, or Continued Problem Potential:     Dimension 6:   Recovery/Living Environment:     ASAM Severity Score:    ASAM Recommended Level of Treatment:     Substance use Disorder (SUD)    Recommendations for Services/Supports/Treatments:   Per Psyc NP Jackie Thompson patient to be reassessed  DSM5 Diagnoses: Patient Active Problem List   Diagnosis Date Noted   Acute psychosis (HCC) 01/06/2021   Seizure disorder (HCC) 01/06/2021    Patient Centered Plan: Patient is on the following Treatment Plan(s):  Anxiety   Referrals to Alternative Service(s): Referred to Alternative Service(s):   Place:   Date:   Time:    Referred to Alternative Service(s):   Place:   Date:   Time:    Referred to Alternative Service(s):   Place:   Date:   Time:    Referred to Alternative Service(s):   Place:     Date:   Time:     Meganne Rita A Lattie Cervi, LCAS-A 

## 2021-01-06 NOTE — Consult Note (Incomplete Revision)
[facility] Face-to-Face Psychiatry Consult   Reason for Consult: Psychiatric evaluation Referring Physician: Dr. Sung Patient Identification: George Mann MRN:  [id number] Principal Diagnosis: <principal problem not specified> Diagnosis:  Active Problems:   Acute psychosis (HCC)   Seizure disorder (HCC)  Total Time spent with patient: 20 minutes  Subjective: " I know where I am.  I am in the hospital." George Mann is a 69 y.o. male patient presented to [facility] ED via law enforcement under involuntary commitment status (IVC). Per the ED triage nurse note, Pt brought by bpd today under IVC, pt denies any SI or HI at this time. States does feel depressed.  The patient is a poor historian. The patient was asked why did he had come to the hospital? " I forgot what I was doing, and I wanted to get out of jail." The patient is speaking nonsensically.  Per the IVC paperwork, he states "history of seizure disorder, aphasia, possible dementia, attempted to jump from moving vehicle, suicidal thoughts and access to firearms." The patient was seen face-to-face by this provider; the chart was reviewed and consulted with Dr. Sung on 01/06/2021 due to the patient's care. It was discussed with the EDP that the patient would remain under observation overnight and will be reassessed in the a.m. to determine if he meets the criteria for psychiatric inpatient admission; he could be discharged.  On evaluation, the patient is alert and oriented x 2, calm and cooperative, but unable to comprehend questions asked. The patient is speaking nonsensically, words salad. Therefore he is unable to participate in the assessment process fully.  HPI: per Dr. Sung, George Mann is a 69 y.o. male brought to the ED under IVC for depression; jumped out of a moving vehicle today.  Patient uncooperative and that he does not wish to answer questions currently.  History of CVA with baseline aphasia.  Denies fever, cough, chest pain, shortness of  breath, abdominal pain, nausea, vomiting or diarrhea.  Past Psychiatric History:   Risk to Self:   Risk to Others:   Prior Inpatient Therapy:   Prior Outpatient Therapy:    Past Medical History: No past medical history on file.  Family History: No family history on file. Family Psychiatric  History:  Social History:  Social History   Substance and Sexual Activity  Alcohol Use Not on file     Social History   Substance and Sexual Activity  Drug Use Not on file    Social History   Socioeconomic History  . Marital status: Divorced    Spouse name: Not on file  . Number of children: Not on file  . Years of education: Not on file  . Highest education level: Not on file  Occupational History  . Not on file  Tobacco Use  . Smoking status: Not on file  . Smokeless tobacco: Not on file  Substance and Sexual Activity  . Alcohol use: Not on file  . Drug use: Not on file  . Sexual activity: Not on file  Other Topics Concern  . Not on file  Social History Narrative  . Not on file   Social Determinants of Health   Financial Resource Strain: Not on file  Food Insecurity: Not on file  Transportation Needs: Not on file  Physical Activity: Not on file  Stress: Not on file  Social Connections: Not on file   Additional Social History:    Allergies:  Not on File  Labs:  Results for orders placed or performed   during the hospital encounter of 01/06/21 (from the past 48 hour(s))  CBC     Status: Abnormal   Collection Time: 01/05/21 10:32 PM  Result Value Ref Range   WBC 5.6 4.0 - 10.5 K/uL   RBC 3.98 (L) 4.22 - 5.81 MIL/uL   Hemoglobin 12.1 (L) 13.0 - 17.0 g/dL   HCT 35.3 (L) 39.0 - 52.0 %   MCV 88.7 80.0 - 100.0 fL   MCH 30.4 26.0 - 34.0 pg   MCHC 34.3 30.0 - 36.0 g/dL   RDW 14.2 11.5 - 15.5 %   Platelets 284 150 - 400 K/uL   nRBC 0.0 0.0 - 0.2 %    Comment: Performed at [facility], [address] [place], [place] [address]  Comprehensive metabolic  panel     Status: Abnormal   Collection Time: 01/05/21 10:32 PM  Result Value Ref Range   Sodium 132 (L) 135 - 145 mmol/L   Potassium 4.0 3.5 - 5.1 mmol/L   Chloride 95 (L) 98 - 111 mmol/L   CO2 26 22 - 32 mmol/L   Glucose, Bld 102 (H) 70 - 99 mg/dL    Comment: Glucose reference range applies only to samples taken after fasting for at least 8 hours.   BUN 9 8 - 23 mg/dL   Creatinine, Ser 0.72 0.61 - 1.24 mg/dL   Calcium 8.6 (L) 8.9 - 10.3 mg/dL   Total Protein 8.2 (H) 6.5 - 8.1 g/dL   Albumin 3.8 3.5 - 5.0 g/dL   AST 16 15 - 41 U/L   ALT 15 0 - 44 U/L   Alkaline Phosphatase 88 38 - 126 U/L   Total Bilirubin 0.5 0.3 - 1.2 mg/dL   GFR, Estimated >60 >60 mL/min    Comment: (NOTE) Calculated using the CKD-EPI Creatinine Equation (2021)    Anion gap 11 5 - 15    Comment: Performed at  [facility], [address] [place], [place] [address]  Urine Drug Screen, Qualitative ([facility] only)     Status: None   Collection Time: 01/05/21 10:32 PM  Result Value Ref Range   Tricyclic, Ur Screen NONE DETECTED NONE DETECTED   Amphetamines, Ur Screen NONE DETECTED NONE DETECTED   MDMA (Ecstasy)Ur Screen NONE DETECTED NONE DETECTED   Cocaine Metabolite,Ur [place] NONE DETECTED NONE DETECTED   Opiate, Ur Screen NONE DETECTED NONE DETECTED   Phencyclidine (PCP) Ur S NONE DETECTED NONE DETECTED   Cannabinoid 50 Ng, Ur [place] NONE DETECTED NONE DETECTED   Barbiturates, Ur Screen NONE DETECTED NONE DETECTED   Benzodiazepine, Ur Scrn NONE DETECTED NONE DETECTED   Methadone Scn, Ur NONE DETECTED NONE DETECTED    Comment: (NOTE) Tricyclics + metabolites, urine    Cutoff 1000 ng/mL Amphetamines + metabolites, urine  Cutoff 1000 ng/mL MDMA (Ecstasy), urine              Cutoff 500 ng/mL Cocaine Metabolite, urine          Cutoff 300 ng/mL Opiate + metabolites, urine        Cutoff 300 ng/mL Phencyclidine (PCP), urine         Cutoff 25 ng/mL Cannabinoid, urine                 Cutoff 50 ng/mL Barbiturates +  metabolites, urine  Cutoff 200 ng/mL Benzodiazepine, urine              Cutoff 200 ng/mL Methadone, urine                     Cutoff 300 ng/mL  The urine drug screen provides only a preliminary, unconfirmed analytical test result and should not be used for non-medical purposes. Clinical consideration and professional judgment should be applied to any positive drug screen result due to possible interfering substances. A more specific alternate chemical method must be used in order to obtain a confirmed analytical result. Gas chromatography / mass spectrometry (GC/MS) is the preferred confirm atory method. Performed at [facility], [address] [place], [place] [address]   Urinalysis, Complete w Microscopic Urine, Clean Catch     Status: Abnormal   Collection Time: 01/05/21 10:32 PM  Result Value Ref Range   Color, Urine STRAW (A) YELLOW   APPearance CLEAR (A) CLEAR   Specific Gravity, Urine 1.003 (L) 1.005 - 1.030   pH 7.0 5.0 - 8.0   Glucose, UA NEGATIVE NEGATIVE mg/dL   Hgb urine dipstick SMALL (A) NEGATIVE   Bilirubin Urine NEGATIVE NEGATIVE   Ketones, ur NEGATIVE NEGATIVE mg/dL   Protein, ur NEGATIVE NEGATIVE mg/dL   Nitrite NEGATIVE NEGATIVE   Leukocytes,Ua NEGATIVE NEGATIVE   RBC / HPF 0-5 0 - 5 RBC/hpf   WBC, UA 0-5 0 - 5 WBC/hpf   Bacteria, UA NONE SEEN NONE SEEN   Squamous Epithelial / LPF NONE SEEN 0 - 5    Comment: Performed at [facility], [address] [place], [place] [address]  Troponin I (High Sensitivity)     Status: None   Collection Time: 01/06/21  1:19 AM  Result Value Ref Range   Troponin I (High Sensitivity) 7 <18 ng/L    Comment: (NOTE) Elevated high sensitivity troponin I (hsTnI) values and significant  changes across serial measurements may suggest ACS but many other  chronic and acute conditions are known to elevate hsTnI results.  Refer to the "Links" section for chest pain algorithms and additional  guidance. Performed at  [facility], [address] [place], [place] [address]     No current facility-administered medications for this encounter.   No current outpatient medications on file.    Musculoskeletal: Strength & Muscle Tone: within normal limits Gait & Station: normal Patient leans: N/A  Psychiatric Specialty Exam: Physical Exam Vitals and nursing note reviewed.  Constitutional:      General: He is in acute distress.     Appearance: He is normal weight.  HENT:     Nose: Nose normal.     Mouth/Throat:     Mouth: Mucous membranes are moist.  Pulmonary:     Effort: Pulmonary effort is normal.  Musculoskeletal:        General: Normal range of motion.     Cervical back: Normal range of motion and neck supple.  Neurological:     Mental Status: He is alert.  Psychiatric:        Attention and Perception: He is inattentive.        Mood and Affect: Mood is anxious. Affect is blunt and flat.        Behavior: Behavior is slowed and aggressive.        Cognition and Memory: Memory is impaired. He exhibits impaired recent memory.        Judgment: Judgment is impulsive and inappropriate.     Review of Systems  All other systems reviewed and are negative.   Blood pressure (!) 153/89, pulse 83, temperature 98.2 F (36.8 C), temperature source Oral, resp. rate 20, height 5\' 8"  (1.727 m), weight 72.6 kg, SpO2 100 %.Body  mass index is 24.33 kg/m.  General Appearance: Bizarre  Eye Contact:  Poor  Speech:  Blocked and Slow  Volume:  Decreased  Mood:  Angry, Anxious, Depressed and Irritable  Affect:  Blunt, Depressed and Inappropriate  Thought Process:  Disorganized  Orientation:  Other:  A&Ox2  Thought Content:  Illogical  Suicidal Thoughts:  No  Homicidal Thoughts:  No  Memory:  Immediate;   Poor Recent;   Poor Remote;   Poor  Judgement:  Impaired  Insight:  Lacking  Psychomotor Activity:  Increased  Concentration:  Concentration: Poor and Attention Span: Poor  Recall:  Poor   Fund of Knowledge:  Poor  Language:  Poor  Akathisia:  Negative  Handed:  Right  AIMS (if indicated):     Assets:  Communication Skills Desire for Improvement Resilience Social Support  ADL's:  Intact  Cognition:  Impaired,  Mild  Sleep:      Treatment Plan Summary: Daily contact with patient to assess and evaluate symptoms and progress in treatment, Medication management and Plan The patient remained under observation overnight and will be reassessed in the a.m. to determine if he meets the criteria for psychiatric inpatient admission; he could be discharged   Disposition:  The patient remained under observation overnight and will be reassessed in the a.m. to determine if he meets the criteria for psychiatric inpatient admission; he could be discharged   Jacqueline Thompson, NP 01/06/2021 3:33 AM 

## 2021-01-06 NOTE — BH Assessment (Addendum)
Referral information for Psychiatric Hospitalization faxed to:  [facility] [phone])  [facility] [phone] -or- [phone],  [phone])  [place] [phone]),   [place] [phone] or [phone]),   .[name] [phone] -or- [phone]),   . [facility] [phone])

## 2021-01-06 NOTE — ED Notes (Addendum)
Pt refused snack at this time.  

## 2021-01-06 NOTE — Progress Notes (Signed)
Brief Pharmacy Note  Patient started on PTA warfarin. Per review of Care Everywhere, INR last checked 1/11 and supratherapeutic (4.0) at the time. Warfarin regimen changed to 5 mg all days except Monday and Friday when he takes 7.5 mg. Order updated to reflect most current dose of warfarin.  It appears patient last saw Neurology 11/10 and phenytoin dose increased to 400 mg daily at that time. Phenytoin level here significantly elevated at 33. Unclear of timing of last dose but patient has not received a dose while here. Currently on hold.  I left a voicemail for the patient's sister to attempt to reconcile medications. Will follow up.  Laureen Ochs, PharmD

## 2021-01-07 DIAGNOSIS — F349 Persistent mood [affective] disorder, unspecified: Secondary | ICD-10-CM | POA: Diagnosis not present

## 2021-01-07 MED ORDER — WARFARIN SODIUM 7.5 MG PO TABS: 7.5000 mg | ORAL_TABLET | ORAL | Status: DC

## 2021-01-07 MED ORDER — WARFARIN SODIUM 5 MG PO TABS
5.0000 mg | ORAL_TABLET | ORAL | Status: DC
Start: 2021-01-07 — End: 2021-01-08
  Administered 2021-01-07: 5 mg via ORAL
  Filled 2021-01-07: qty 1

## 2021-01-07 MED ORDER — OLANZAPINE 5 MG PO TBDP
10.0000 mg | ORAL_TABLET | Freq: Once | ORAL | Status: AC
  Administered 2021-01-07: 10 mg via ORAL
  Filled 2021-01-07: qty 2

## 2021-01-07 NOTE — BH Assessment (Addendum)
PATIENT IS SCHEDULED FOR ADMISSION ANYTIME TOMORROW MORNING  Patient has been accepted to [facility]   Patient assigned to: Main Unit  Accepting physician is Dr. Ashley Royalty  Call report to [phone] Representative was Tammy    ER Staff is aware of it:  Nitchia, ER Secretary  Larinda Buttery, ER MD  Amy, Patient's Nurse  Patient's Family/Support System [facility] [phone] & Calla Kicks [phone]) have been updated as well.  TTS faxed pt negative COVID results as requested at 5pm

## 2021-01-07 NOTE — TOC Progression Note (Signed)
Transition of Care [facility]) - Progression Note    Patient Details  Name: George Mann MRN: [id number] Date of Birth: 27-Mar-1951  Transition of Care [facility]) CM/SW Contact  Verita Schneiders Rimsha Trembley Phone Number: 01/07/2021, 10:29 AM  Clinical Narrative:     Received phone call from [facility] [phone] (cell). I couldn't confirm or deny pt was here as I don't see a password. She said they share a house together and take care of each other. She asks if [facility] had any beds.  She had contacted SW at [facility] and was told referral would need to come from the hospital.  SW # is [phone]    Expected Discharge Plan and Services                                                 Social Determinants of Health (SDOH) Interventions    Readmission Risk Interventions No flowsheet data found.

## 2021-01-07 NOTE — ED Notes (Signed)
Patient repeatedly coming into hallway at this time, attempting to enter other rooms. Patient A&O to self. States "I need to go to the store". Attempted to redirect patient and he states "This is gonna get ugly". Redirected patient to room after several attempts.

## 2021-01-07 NOTE — BH Assessment (Addendum)
Referral check:  George Mann [phone]) No staff available until morning  [facility] [phone] -or- [phone],  [phone]) No intake staff available until 7 or 8am  [facility] ([phone]),Shelly reports no male beds available currently  [place] [phone] or [phone]), No available staff until morning  .[name] [phone] -or- [phone]), Durenda Age reports referral not received, patient information re-faxed at 4:05am on 01/07/21  . [facility] [phone]) Previous TTS note reports to follow up later today 01/07/21

## 2021-01-07 NOTE — BH Assessment (Addendum)
Referral Check:  George Mann [phone]) George Mann reports not admitting [facility] pt's at this time.   [facility] [phone]- [phone], [phone]) George Mann reports pt to be on waitlist and due to an unexpected discharge, agrees to call back after another quick review  [facility] [phone]),George Mann reports no male beds available currently  George Mann [phone] or [phone]), George Mann contacted George Mann Who denied receiving additional information. TTS re-faxed the requested information to George Mann at [phone] & [phone]. 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 [facility] [phone]), Refer to note posted at 11:55am

## 2021-01-07 NOTE — ED Notes (Signed)
On phone with sister.

## 2021-01-07 NOTE — ED Notes (Addendum)
Pt wandering around day room, in and out of his room.  Lunch served, had phone priveledges, and is cooperative

## 2021-01-07 NOTE — ED Notes (Signed)
Pt transferred into ED BHU room 4   Patient assigned to appropriate care area. Patient oriented to unit/care area: Informed that, for his safety, care areas are designed for safety and monitored by security cameras at all times; Visiting hours and phone times explained to patient. Patient verbalizes understanding, and verbal contract for safety obtained.   Assessment completed  He denies pain

## 2021-01-07 NOTE — ED Notes (Signed)
Pt back in room after shower; new clothing provided.

## 2021-01-07 NOTE — ED Provider Notes (Signed)
Emergency Medicine Observation Re-evaluation Note  George Mann is a 70 y.o. male, seen on rounds today.  Pt initially presented to the ED for complaints of No chief complaint on file. Currently, the patient is resting.  Physical Exam  BP 132/69 (BP Location: Right Arm)   Pulse 72   Temp 98.8 F (37.1 C) (Oral)   Resp 18   SpO2 93%  Physical Exam Constitutional:      Appearance: He is not ill-appearing or toxic-appearing.  HENT:     Head: Atraumatic.  Cardiovascular:     Comments: Well perfused Pulmonary:     Effort: Pulmonary effort is normal.  Abdominal:     General: There is no distension.  Musculoskeletal:        General: No deformity.  Skin:    Findings: No rash.  Neurological:     Mental Status: He is oriented to person, place, and time. Mental status is at baseline.     Comments: Dysarthric at basline      ED Course / MDM  EKG:EKG Interpretation  Date/Time:  Tuesday January 06 2021 01:43:30 EST Ventricular Rate:  77 PR Interval:  150 QRS Duration: 86 QT Interval:  376 QTC Calculation: 425 R Axis:   -28 Text Interpretation: Normal sinus rhythm Normal ECG Confirmed by UNCONFIRMED, DOCTOR ([id number]), editor Fredric Mare, Tammy [phone]) on 01/06/2021 10:34:55 AM    I have reviewed the labs performed to date as well as medications administered while in observation.  Recent changes in the last 24 hours include patient required single dose of zyprexa SL due to repeated attempts to walk out and vaguely threatening RN staff. .  Plan  Current plan is for psychiatric care. Patient is under full IVC at this time.   Delton Prairie, MD 01/07/21 [phone]

## 2021-01-07 NOTE — BH Assessment (Addendum)
TTS was contacted earlier this morning by Zella Ball Sandre Kitty) who reported pt to be under review and requested for EKG, Medical clearance note, COVID results and amended IVC be faxed to [phone]. TTS completed the task at 11:54am.   TTS made contact with [facility] [phone] for referral update. TTS spoke to [employer] (Intake Coordinator [phone]) who provided the steps to their referral process. Crystal agreed to make contact with TTS to complete their screening process on a recorded line and if pt is appropriate a fax number will be provided to submit referral. TTS is currently awaiting a call back to complete screening.   TTS completed screening with Crystal [facility]) at 12:25pm. Crystal reports no current bed availability and requested for the referral to be faxed to [phone]. Crystal agreed to make contact before the end of today with a final decision pending today's discharges. TTS faxed referral at 12:45pm.   TTS was contacted by Crystal at 4pm who reports no bed availability today due to no discharges.

## 2021-01-07 NOTE — ED Notes (Signed)
PT accepted at [facility], to be transported 1.27.22.

## 2021-01-07 NOTE — ED Notes (Signed)
IVC PT accepted at [facility], to be transported 1.27.22.

## 2021-01-07 NOTE — ED Notes (Signed)

## 2021-01-08 NOTE — ED Notes (Signed)
[facility]  Dept  called  for transport  to  [facility]

## 2021-01-08 NOTE — ED Notes (Signed)
REPORT ATTEMPTED TO [place] [facility], NO ANSWER PROVIDED WHEN TRANSFERRED TO RECEIVING NURSE ASCOM PHONE.

## 2021-01-08 NOTE — ED Notes (Signed)
SHERIFF HERE TO PICK PATIENT UP AND TRANSFER TO [place] [facility], BELONGING RETURNED, SECURITY TO RETRIEVE PATIENTS WALLET THAT WAS LOCKED UP. PATIENT UNDRESSED FROM [facility] TO HIS [employer]ULAR [employer]OTH[employer] FOR TRANSPORT.

## 2021-01-08 NOTE — ED Provider Notes (Signed)
Emergency Medicine Observation Re-evaluation Note  George Mann is a 70 y.o. male, seen on rounds today.  Pt initially presented to the ED for complaints of No chief complaint on file.  Currently, the patient is is no acute distress. Plan to go to [facility]   Physical Exam  Blood pressure 119/68, pulse 88, temperature 98 F (36.7 C), temperature source Oral, resp. rate 17, SpO2 99 %.  Physical Exam General: No apparent distress HEENT: moist mucous membranes CV: RRR Pulm: Normal WOB GI: soft and non tender MSK: no edema or cyanosis Neuro: face symmetric, moving all extremities     ED Course / MDM     I have reviewed the labs performed to date as well as medications administered while in observation.  Recent changes in the last 24 hours include none- plan to leave today   Plan   Current plan is to continue to wait for psych plan/placement if felt warranted  Patient is under full IVC at this time.   Concha Se, MD 01/08/21 [phone]

## 2021-01-08 NOTE — ED Notes (Signed)
UNABLE TO GIVE REPORT - STEPHANIE RN IN THE BHU ON OPPOSITE SIDE OF UNIT HAS  TRANSFEERED CALL 3 TIMES TO RECIEVING NURSE WITH NO ANSWER. MESSAGE LEFT WITH # FOR RECIEVING NURSE TO CALL FOR REPORT.

## 2021-08-12 IMAGING — CT CT HEAD W/O CM
3 series · 15 of 47 positions shown, 18 images · non-contrast
Comparison: None.

CLINICAL DATA: Chronic aphasia mental status change

EXAM:
CT HEAD WITHOUT CONTRAST
TECHNIQUE: Contiguous axial images were obtained from the base of the skull
through the vertex without intravenous contrast.

[Series 2: head wo · axial · 0.41mm/px · z∈[-138,-8]mm · 9 of 32 slices shown, 12 images]
[im 3/32  brain]
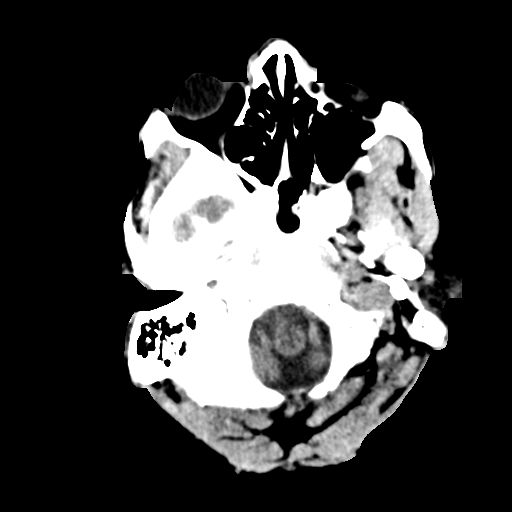
[im 3/32  bone]
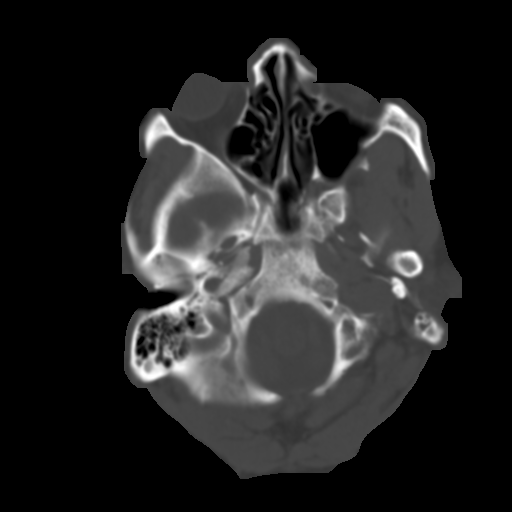
[im 6/32  brain]
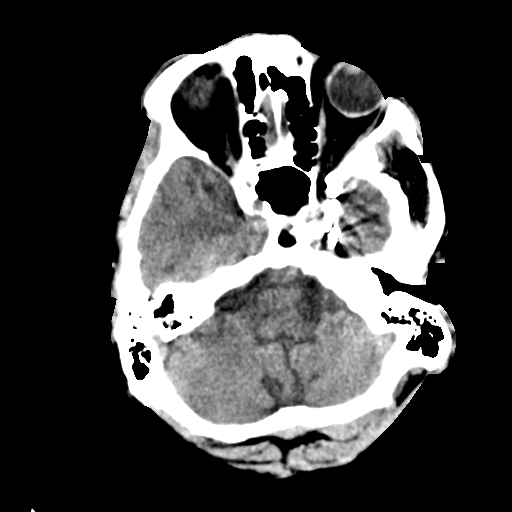
[im 9/32  brain]
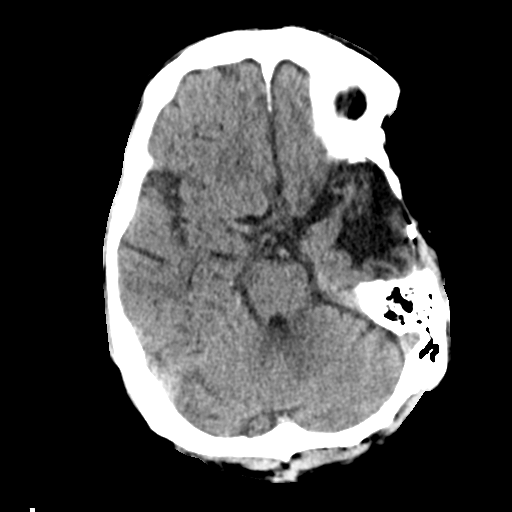
[im 12/32  brain]
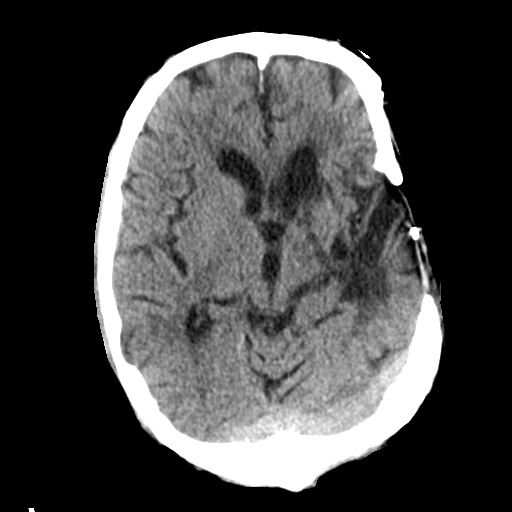
[im 17/32  brain]
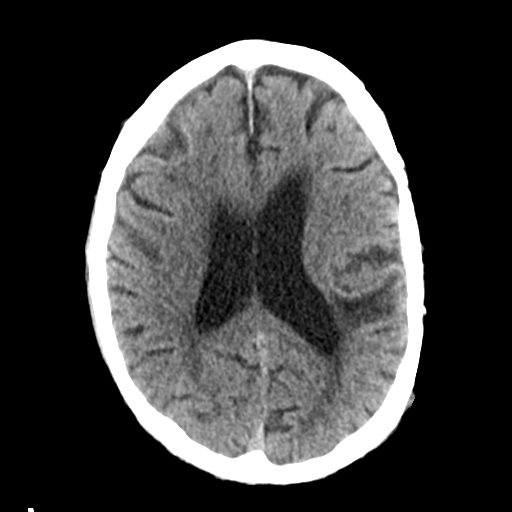
[im 17/32  bone]
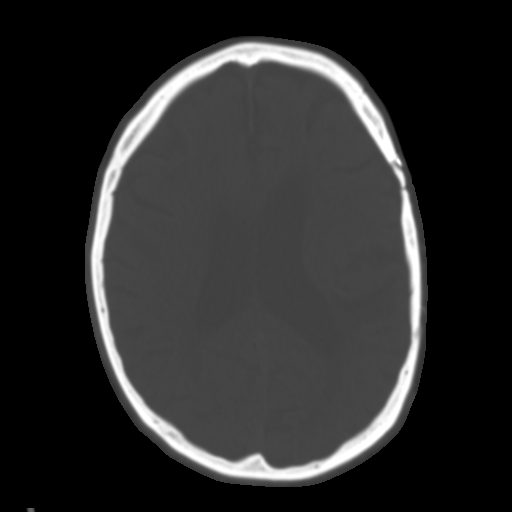
[im 20/32  brain]
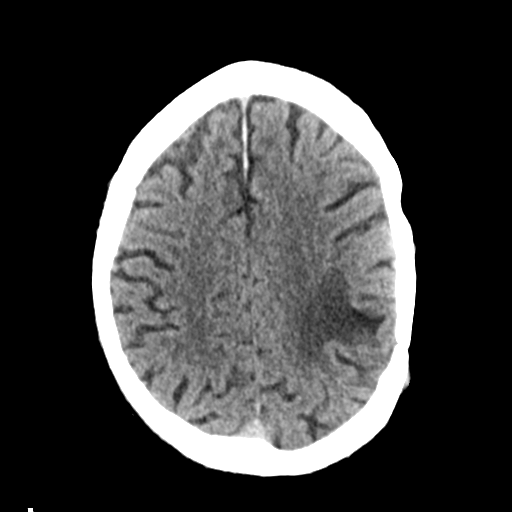
[im 23/32  brain]
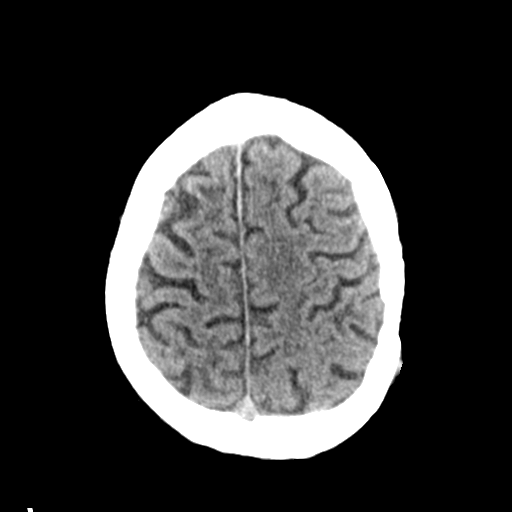
[im 26/32  brain]
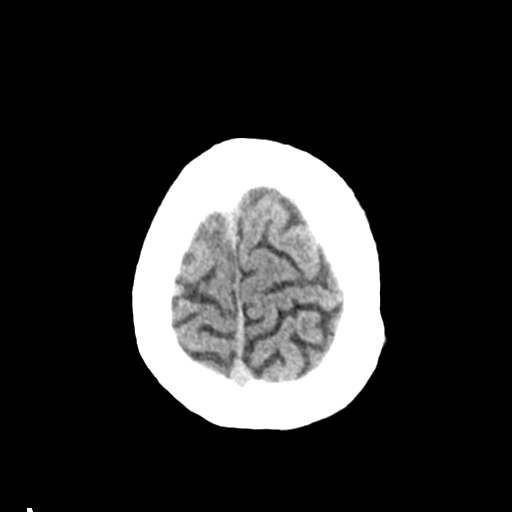
[im 29/32  brain]
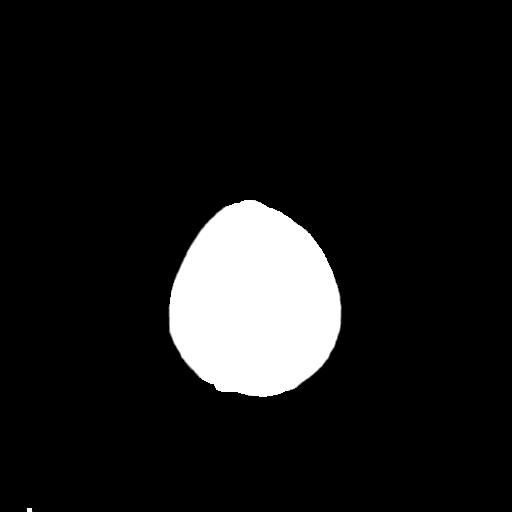
[im 29/32  bone]
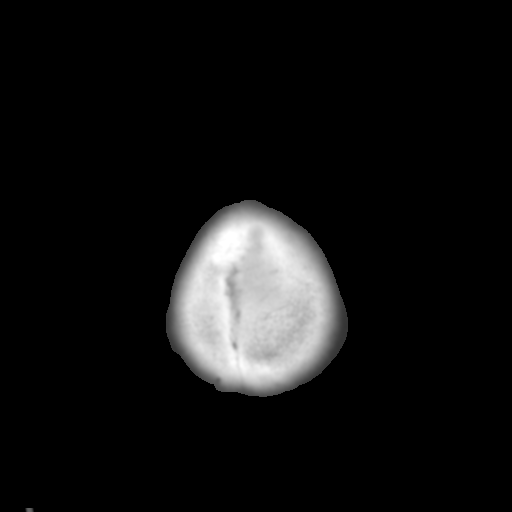

[Series 4: coronal soft tissue · coronal · 0.32mm/px · 3 of 62 slices shown]
[im 21/62  brain]
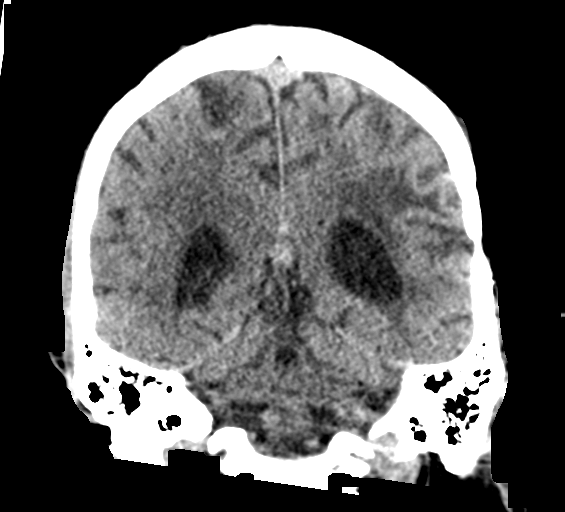
[im 28/62  brain]
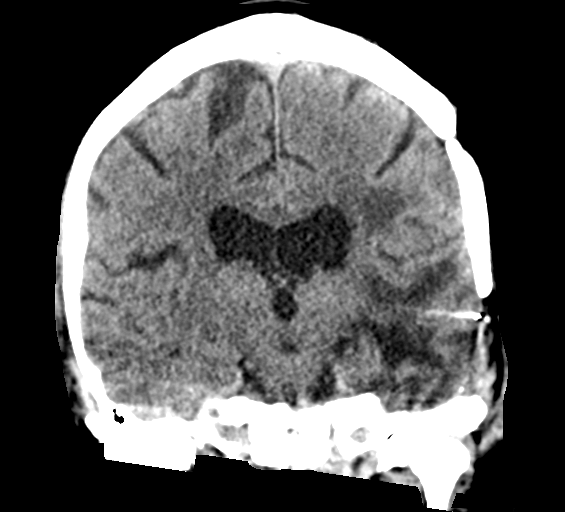
[im 34/62  brain]
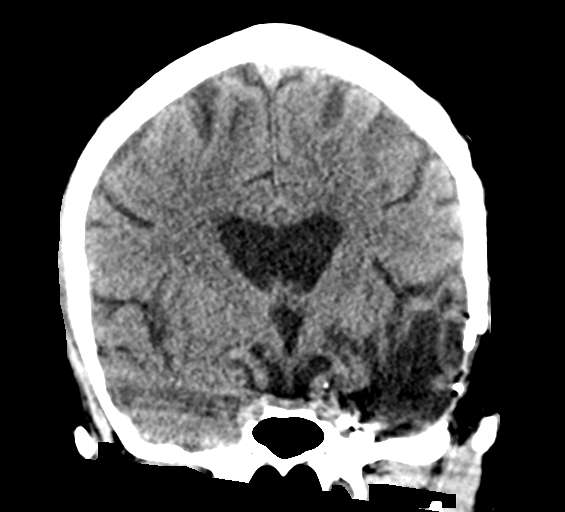

[Series 5: sagittal soft tissue · sagittal · 0.32mm/px · 3 of 48 slices shown]
[im 18/48  brain]
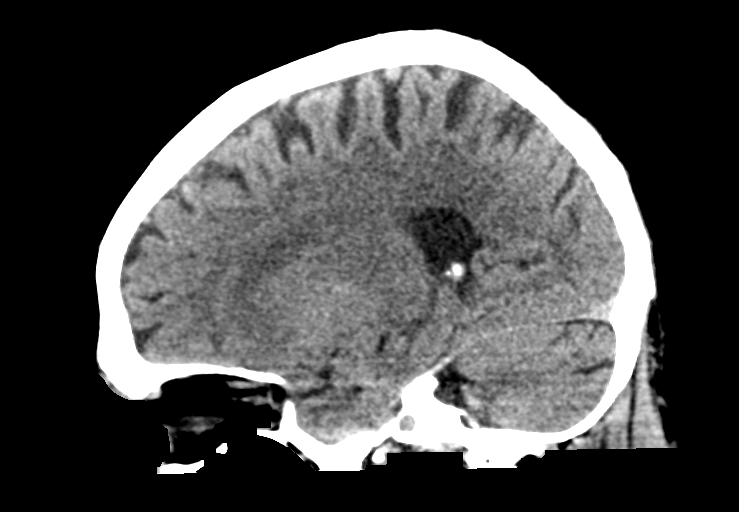
[im 24/48  brain]
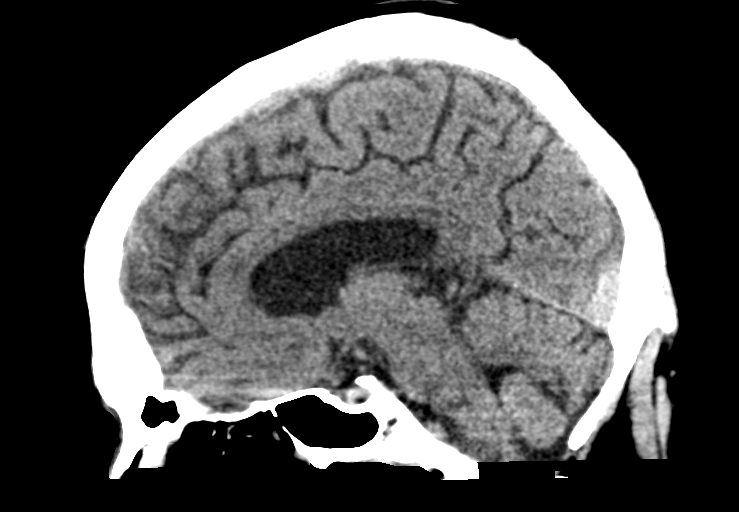
[im 30/48  brain]
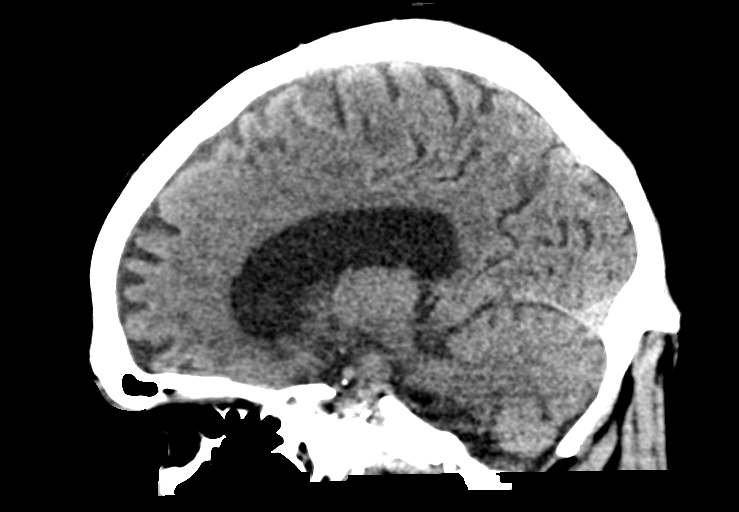

[15 of 47 positions shown; findings below may reference images not displayed]

FINDINGS: Brain: No acute territorial infarction, intracranial hemorrhage or
mass is visualized. Mild atrophy. Encephalomalacia within the left
temporal and parietal lobes with involvement of left insula and
basal ganglia. Mild ex vacuo dilatation of left lateral ventricle.
Mild hypodensity in the white matter likely chronic small vessel
ischemic change

Vascular: No hyperdense vessels.  No unexpected calcification.

Skull: Left temporoparietal craniotomy. Clips within the medial
aspect of left middle cranial fossa. No fracture

Sinuses/Orbits: No acute finding.

Other: None
IMPRESSION: 1. No CT evidence for acute intracranial abnormality.
2. Left temporoparietal and basal ganglial encephalomalacia
3. Atrophy and mild chronic small vessel ischemic changes of the
white matter.

## 2023-12-14 DEATH — deceased
